# Patient Record
Sex: Female | Born: 1945 | Race: Black or African American | Hispanic: No | Marital: Single | Smoking: Former smoker
Health system: Southern US, Community
[De-identification: ages and names within clinical notes are randomized; demographics above are authoritative.]

## PROBLEM LIST (undated history)

## (undated) DIAGNOSIS — I10 Essential (primary) hypertension: Secondary | ICD-10-CM

## (undated) DIAGNOSIS — I639 Cerebral infarction, unspecified: Secondary | ICD-10-CM

---

## 2017-07-04 ENCOUNTER — Emergency Department (HOSPITAL_COMMUNITY)
Admission: EM | Admit: 2017-07-04 | Discharge: 2017-07-05 | Disposition: A | Discharge status: Home / Self Care | Payer: Medicare HMO | Attending: Emergency Medicine | Admitting: Emergency Medicine

## 2017-07-04 ENCOUNTER — Encounter (HOSPITAL_COMMUNITY): Payer: Self-pay

## 2017-07-04 DIAGNOSIS — Y998 Other external cause status: Secondary | ICD-10-CM | POA: Diagnosis not present

## 2017-07-04 DIAGNOSIS — Y929 Unspecified place or not applicable: Secondary | ICD-10-CM | POA: Diagnosis not present

## 2017-07-04 DIAGNOSIS — W01198A Fall on same level from slipping, tripping and stumbling with subsequent striking against other object, initial encounter: Secondary | ICD-10-CM | POA: Diagnosis not present

## 2017-07-04 DIAGNOSIS — W19XXXA Unspecified fall, initial encounter: Secondary | ICD-10-CM

## 2017-07-04 DIAGNOSIS — Y9389 Activity, other specified: Secondary | ICD-10-CM | POA: Diagnosis not present

## 2017-07-04 DIAGNOSIS — S0990XA Unspecified injury of head, initial encounter: Secondary | ICD-10-CM

## 2017-07-04 DIAGNOSIS — I10 Essential (primary) hypertension: Secondary | ICD-10-CM | POA: Insufficient documentation

## 2017-07-04 DIAGNOSIS — Z8673 Personal history of transient ischemic attack (TIA), and cerebral infarction without residual deficits: Secondary | ICD-10-CM | POA: Diagnosis not present

## 2017-07-04 DIAGNOSIS — S42212A Unspecified displaced fracture of surgical neck of left humerus, initial encounter for closed fracture: Secondary | ICD-10-CM | POA: Diagnosis not present

## 2017-07-04 HISTORY — DX: Essential (primary) hypertension: I10

## 2017-07-04 HISTORY — DX: Cerebral infarction, unspecified: I63.9

## 2017-07-04 NOTE — ED Triage Notes (Addendum)
Pt arrived from home via [facility] fell this morning pain on left shoulder pain increasing. Pt states she did hit her head but is not taking blood thinner at this time.

## 2017-07-04 NOTE — ED Notes (Signed)
Bed: [id number] Expected date:  Expected time:  Means of arrival:  Comments: Fall, shoulder pain

## 2017-07-05 ENCOUNTER — Emergency Department (HOSPITAL_COMMUNITY): Payer: Medicare HMO

## 2017-07-05 MED ORDER — TRAMADOL HCL 50 MG PO TABS: 50.0000 mg | ORAL_TABLET | Freq: Four times a day (QID) | ORAL | 0 refills | Status: DC | PRN

## 2017-07-05 MED ORDER — TRAMADOL HCL 50 MG PO TABS
50.0000 mg | ORAL_TABLET | Freq: Once | ORAL | Status: AC
  Administered 2017-07-05: 50 mg via ORAL
  Filled 2017-07-05: qty 1

## 2017-07-05 NOTE — Discharge Instructions (Addendum)
Your x-ray does show fracture of the left shoulder.  Wear the shoulder immobilizer.  Ice the area.  Have given you short course of tramadol to help with the pain.  Will also take Tylenol or Motrin.  Limit mobility of the shoulder.  Make sure that you follow-up with the orthopedic doctor.  They will see you in the office on Friday or Saturday.  You need to call today to schedule an appointment.  Follow-up with primary care doctor as well.  Return the ED with any worsening symptoms.

## 2017-07-05 NOTE — ED Provider Notes (Signed)
[facility]-EMERGENCY DEPT Provider Note   CSN: [id number] Arrival date & time: 07/04/17  2312     History   Chief Complaint Chief Complaint  Patient presents with  . Fall    HPI Michelle Irwin is a 72 y.o. female.  HPI 72 year old African-American female past medical history significant for CVA and hypertension presents to the emergency department today for evaluation of fall with left shoulder pain.  The patient states that earlier this morning she was walking and when she walked to the doorway she is not sure if she tripped but she fell backwards striking the back of her head and landing on her left shoulder.  Patient denies any loss of consciousness.  Denies any lightheadedness or dizziness.  Patient reports hitting the back of her head but denies take any blood thinners.  Patient states that she was on the floor for several minutes approximately 10-15 minutes before her daughter came to help her.  Patient states that she was helped to her feet and has been ambulatory since the event.  She reports ongoing left shoulder pain that is worse with palpation and range of motion.  Patient denies any associated headaches, lightheadedness or dizziness.  Denies any vision changes.  Denies any associated paresthesias or weakness.  She states that she did take some Tylenol earlier for pain but did not help.  Patient denies any chest pain or shortness of breath with the episode.  Pt denies any fever, chill, ha, vision changes, lightheadedness, dizziness, congestion, neck pain, cp, sob, cough, abd pain, n/v/d, urinary symptoms, change in bowel habits, melena, hematochezia, lower extremity paresthesias.  Past Medical History:  Diagnosis Date  . CVA (cerebral vascular accident) ([facility])    2016  . Hypertension     There are no active problems to display for this patient.   History reviewed. No pertinent surgical history.   OB History   None      Home Medications    Prior  to Admission medications   Medication Sig Start Date End Date Taking? Authorizing Provider  traMADol (ULTRAM) 50 MG tablet Take 1 tablet (50 mg total) by mouth every 6 (six) hours as needed. 07/05/17   Rise MuLeaphart, Jessicia Napolitano T, PA-C    Family History History reviewed. No pertinent family history.  Social History Social History   Tobacco Use  . Smoking status: Never Smoker  . Smokeless tobacco: Never Used  Substance Use Topics  . Alcohol use: Never    Frequency: Never  . Drug use: Never     Allergies   Penicillins   Review of Systems Review of Systems  All other systems reviewed and are negative.    Physical Exam Updated Vital Signs BP 117/81 (BP Location: Right Arm)   Pulse (!) 57   Temp 97.8 F (36.6 C) (Oral)   Resp 18   Ht 5\' 8"  (1.727 m)   Wt 86.2 kg (190 lb)   SpO2 94%   BMI 28.89 kg/m   Physical Exam  Constitutional: She is oriented to person, place, and time. She appears well-developed and well-nourished.  Non-toxic appearance. No distress.  HENT:  Head: Normocephalic and atraumatic.  Nose: Nose normal.  Mouth/Throat: Oropharynx is clear and moist.  Bilateral hemotympanum.  No septal hematoma.  No skull depression.  No hematoma noted.  Eyes: Pupils are equal, round, and reactive to light. Conjunctivae and EOM are normal. Right eye exhibits no discharge. Left eye exhibits no discharge.  Neck: Normal range of motion. Neck  supple.  No c spine midline tenderness. No paraspinal tenderness. No deformities or step offs noted. Full ROM. Supple. No nuchal rigidity.    Cardiovascular: Normal rate, regular rhythm, normal heart sounds and intact distal pulses. Exam reveals no gallop and no friction rub.  No murmur heard. Pulmonary/Chest: Effort normal and breath sounds normal. No stridor. No respiratory distress. She has no wheezes. She has no rales. She exhibits no tenderness.  Abdominal: Soft. Bowel sounds are normal. There is no tenderness. There is no rigidity, no  rebound, no guarding, no CVA tenderness, no tenderness at McBurney's point and negative Murphy's sign.  No ecchymosis noted.  Musculoskeletal:       Left shoulder: She exhibits decreased range of motion, tenderness, bony tenderness, swelling, pain and spasm. She exhibits no effusion, no crepitus, no deformity, no laceration, normal pulse and normal strength.  Axillary nerve intact.  Grip strength equal bilaterally.  Brisk cap refill.  Radial pulses 2+ bilaterally.  Sensation intact in all dermatomes.  No obvious deformity noted.  Skin compartments are soft.  No midline T spine or L spine tenderness. No deformities or step offs noted. Full ROM. Pelvis is stable.  Lymphadenopathy:    She has no cervical adenopathy.  Neurological: She is alert and oriented to person, place, and time.  The patient is alert, attentive, and oriented x 3. Speech is clear. Cranial nerve II-VII grossly intact. Negative pronator drift. Sensation intact. Strength 5/5 in all extremities. Reflexes 2+ and symmetric at biceps, triceps, knees, and ankles. Rapid alternating movement and fine finger movements intact.  Romberg and gait deferred due to patient having left shoulder pain.   Skin: Skin is warm and dry. Capillary refill takes less than 2 seconds.  Psychiatric: Her behavior is normal. Judgment and thought content normal.  Nursing note and vitals reviewed.    ED Treatments / Results  Labs (all labs ordered are listed, but only abnormal results are displayed) Labs Reviewed - No data to display  EKG None  Radiology Dg Elbow Complete Left  Result Date: 07/05/2017 CLINICAL DATA:  Left elbow pain after fall today. EXAM: LEFT ELBOW - COMPLETE 3+ VIEW COMPARISON:  None. FINDINGS: Five views of the left elbow are provided. Minimal degenerative spurring off the coronoid. There is no evidence of fracture, dislocation, or joint effusion. There is no evidence of arthropathy or other focal bone abnormality. Soft tissues are  unremarkable. IMPRESSION: Negative for fracture, joint effusion or joint dislocation. Electronically Signed   By: Tollie Eth M.D.   On: 07/05/2017 01:30   Ct Head Wo Contrast  Result Date: 07/05/2017 CLINICAL DATA:  Patient fell at home hitting the occiput. EXAM: CT HEAD WITHOUT CONTRAST CT CERVICAL SPINE WITHOUT CONTRAST TECHNIQUE: Multidetector CT imaging of the head and cervical spine was performed following the standard protocol without intravenous contrast. Multiplanar CT image reconstructions of the cervical spine were also generated. COMPARISON:  None. FINDINGS: CT HEAD FINDINGS Brain: Mild-to-moderate small vessel ischemic disease of periventricular white matter, in particular the left corona radiata. No hydrocephalus, midline shift or edema. Right basal ganglial calcifications are noted. No intra-axial mass nor extra-axial fluid collections. No large vascular territory infarct. Vascular: No hyperdense vessel or unexpected calcification. Skull: No acute skull fracture or suspicious osseous lesions. No significant scalp soft tissue swelling. Sinuses/Orbits: Mild ethmoid, sphenoid and right maxillary sinus mucosal thickening. Trace fluid level in the left maxillary sinus. The frontal sinus is unremarkable. The orbits are symmetric in appearance and intact. Intact globes. No retrobulbar  abnormality. Other: None. CT CERVICAL SPINE FINDINGS Alignment: Normal. Skull base and vertebrae: Intact craniocervical relationship. Osteoarthritis of the atlantodental interval. No acute cervical spine fracture. Intact skull base. Soft tissues and spinal canal: No prevertebral fluid or swelling. No visible canal hematoma. Disc levels: Mild disc space narrowing C4 through T2. Uncinate spurring secondary to uncovertebral joint osteoarthritis at C2-3, on the right at C5-6 and bilaterally at C6-7. Associated multilevel degenerative facet arthropathy. No significant central canal stenosis. C4-5 and C5-6 left-sided foraminal  stenosis from facet arthropathy and on the right from C5 through T1. Upper chest: Centrilobular emphysema in the lung apices with small right effusion. Atherosclerotic calcifications of the included aortic arch and great vessels. Other: Mild extracranial carotid arteriosclerosis bilaterally. IMPRESSION: 1. No acute intracranial abnormality. Chronic appearing small vessel ischemic disease periventricular white matter. 2. Mild paranasal sinus mucosal thickening. 3. Cervical spondylosis without acute cervical spine fracture. Electronically Signed   By: Tollie Eth M.D.   On: 07/05/2017 01:48   Ct Cervical Spine Wo Contrast  Result Date: 07/05/2017 CLINICAL DATA:  Patient fell at home hitting the occiput. EXAM: CT HEAD WITHOUT CONTRAST CT CERVICAL SPINE WITHOUT CONTRAST TECHNIQUE: Multidetector CT imaging of the head and cervical spine was performed following the standard protocol without intravenous contrast. Multiplanar CT image reconstructions of the cervical spine were also generated. COMPARISON:  None. FINDINGS: CT HEAD FINDINGS Brain: Mild-to-moderate small vessel ischemic disease of periventricular white matter, in particular the left corona radiata. No hydrocephalus, midline shift or edema. Right basal ganglial calcifications are noted. No intra-axial mass nor extra-axial fluid collections. No large vascular territory infarct. Vascular: No hyperdense vessel or unexpected calcification. Skull: No acute skull fracture or suspicious osseous lesions. No significant scalp soft tissue swelling. Sinuses/Orbits: Mild ethmoid, sphenoid and right maxillary sinus mucosal thickening. Trace fluid level in the left maxillary sinus. The frontal sinus is unremarkable. The orbits are symmetric in appearance and intact. Intact globes. No retrobulbar abnormality. Other: None. CT CERVICAL SPINE FINDINGS Alignment: Normal. Skull base and vertebrae: Intact craniocervical relationship. Osteoarthritis of the atlantodental interval.  No acute cervical spine fracture. Intact skull base. Soft tissues and spinal canal: No prevertebral fluid or swelling. No visible canal hematoma. Disc levels: Mild disc space narrowing C4 through T2. Uncinate spurring secondary to uncovertebral joint osteoarthritis at C2-3, on the right at C5-6 and bilaterally at C6-7. Associated multilevel degenerative facet arthropathy. No significant central canal stenosis. C4-5 and C5-6 left-sided foraminal stenosis from facet arthropathy and on the right from C5 through T1. Upper chest: Centrilobular emphysema in the lung apices with small right effusion. Atherosclerotic calcifications of the included aortic arch and great vessels. Other: Mild extracranial carotid arteriosclerosis bilaterally. IMPRESSION: 1. No acute intracranial abnormality. Chronic appearing small vessel ischemic disease periventricular white matter. 2. Mild paranasal sinus mucosal thickening. 3. Cervical spondylosis without acute cervical spine fracture. Electronically Signed   By: Tollie Eth M.D.   On: 07/05/2017 01:48   Dg Shoulder Left  Result Date: 07/05/2017 CLINICAL DATA:  Patient fell backward this afternoon. Left shoulder pain. EXAM: LEFT SHOULDER - 2+ VIEW COMPARISON:  None. FINDINGS: AP and scapular Y-views of the left shoulder demonstrate an acute surgical neck and proximal diaphyseal fracture dorsally angulated by approximately 32 degrees. One-quarter shaft width anterior displacement of the distal fracture fragment is seen. Intact humeral head and AC joint. Intact glenohumeral joint. The adjacent ribs and lung are nonacute. Scapula appears intact. IMPRESSION: Acute dorsally angulated surgical neck and proximal diaphyseal fracture of the humerus.  Electronically Signed   By: Tollie Eth M.D.   On: 07/05/2017 01:23   Dg Humerus Left  Result Date: 07/05/2017 CLINICAL DATA:  Patient fell this afternoon and presents with left shoulder pain. EXAM: LEFT HUMERUS - 2+ VIEW COMPARISON:  None.  FINDINGS: An acute, closed, dorsally angulated fracture of the surgical neck and proximal diaphysis of the left humerus is noted with slight comminution. Slight anterior displacement by 1/4 shaft width is noted. Intact AC and glenohumeral joints with osteoarthritic joint space narrowing. The adjacent ribs and lung are nonacute. IMPRESSION: Acute, dorsally angulated, anteriorly displaced closed fracture of the surgical neck and proximal diaphysis of the left humerus with slight comminution. Electronically Signed   By: Tollie Eth M.D.   On: 07/05/2017 01:26    Procedures Procedures (including critical care time)  Medications Ordered in ED Medications  traMADol (ULTRAM) tablet 50 mg (50 mg Oral Given 07/05/17 0135)     Initial Impression / Assessment and Plan / ED Course  I have reviewed the triage vital signs and the nursing notes.  Pertinent labs & imaging results that were available during my care of the patient were reviewed by me and considered in my medical decision making (see chart for details).     She presents to the ED for evaluation of left shoulder pain after mechanical fall earlier today.  Patient also reports hitting the back of her head but denies being any blood thinners.  Denies any LOC.  Patient has been ambulatory since the event.  She is neurovascular intact in all extremities.  No signs of intracranial, intrathoracic or intra-abdominal trauma.  CT scan of head and cervical spine revealed no acute abnormalities.  She does have a displaced and comminuted left surgical neck fracture of the humerus.  There is some angulation noted.  The skin compartments are soft and she is neurovascularly intact.  Limited range of motion secondary to pain.  I did speak with Dr. Ave Filter with orthopedic surgery.  He recommends shoulder immobilizer and will follow up in the clinic in 2 days.  Patient given short course of pain medication.  Encouraged rice therapy at home.  Patient is able to ambulate  with normal gait in the ED.  Pain is been managed in the ED.  Pt is hemodynamically stable, in NAD, & able to ambulate in the ED. Evaluation does not show pathology that would require ongoing emergent intervention or inpatient treatment. I explained the diagnosis to the patient. Pain has been managed & has no complaints prior to dc. Pt is comfortable with above plan and is stable for discharge at this time. All questions were answered prior to disposition. Strict return precautions for f/u to the ED were discussed. Encouraged follow up with PCP.  Pt seen and eval by my attending who is agreeable with the above plan.   Final Clinical Impressions(s) / ED Diagnoses   Final diagnoses:  Fall, initial encounter  Injury of head, initial encounter  Closed displaced fracture of surgical neck of left humerus, unspecified fracture morphology, initial encounter    ED Discharge Orders        Ordered    traMADol (ULTRAM) 50 MG tablet  Every 6 hours PRN     07/05/17 0413       Rise Mu, PA-C 07/05/17 1610    Shaune Pollack, MD 07/05/17 1408    Shaune Pollack, MD 07/05/17 1409

## 2018-12-12 DIAGNOSIS — Z008 Encounter for other general examination: Secondary | ICD-10-CM | POA: Diagnosis not present

## 2018-12-12 DIAGNOSIS — E669 Obesity, unspecified: Secondary | ICD-10-CM | POA: Diagnosis not present

## 2018-12-12 DIAGNOSIS — Z79899 Other long term (current) drug therapy: Secondary | ICD-10-CM | POA: Diagnosis not present

## 2018-12-12 DIAGNOSIS — I69352 Hemiplegia and hemiparesis following cerebral infarction affecting left dominant side: Secondary | ICD-10-CM | POA: Diagnosis not present

## 2018-12-12 DIAGNOSIS — M1990 Unspecified osteoarthritis, unspecified site: Secondary | ICD-10-CM | POA: Diagnosis not present

## 2018-12-12 DIAGNOSIS — I10 Essential (primary) hypertension: Secondary | ICD-10-CM | POA: Diagnosis not present

## 2018-12-12 DIAGNOSIS — Z1159 Encounter for screening for other viral diseases: Secondary | ICD-10-CM | POA: Diagnosis not present

## 2018-12-12 DIAGNOSIS — Z6833 Body mass index (BMI) 33.0-33.9, adult: Secondary | ICD-10-CM | POA: Diagnosis not present

## 2018-12-12 DIAGNOSIS — Z0000 Encounter for general adult medical examination without abnormal findings: Secondary | ICD-10-CM | POA: Diagnosis not present

## 2018-12-30 DIAGNOSIS — E785 Hyperlipidemia, unspecified: Secondary | ICD-10-CM | POA: Diagnosis not present

## 2018-12-30 DIAGNOSIS — Z6833 Body mass index (BMI) 33.0-33.9, adult: Secondary | ICD-10-CM | POA: Diagnosis not present

## 2018-12-30 DIAGNOSIS — I10 Essential (primary) hypertension: Secondary | ICD-10-CM | POA: Diagnosis not present

## 2018-12-30 DIAGNOSIS — I739 Peripheral vascular disease, unspecified: Secondary | ICD-10-CM | POA: Diagnosis not present

## 2018-12-30 DIAGNOSIS — Z23 Encounter for immunization: Secondary | ICD-10-CM | POA: Diagnosis not present

## 2018-12-30 DIAGNOSIS — Z1211 Encounter for screening for malignant neoplasm of colon: Secondary | ICD-10-CM | POA: Diagnosis not present

## 2018-12-30 DIAGNOSIS — E669 Obesity, unspecified: Secondary | ICD-10-CM | POA: Diagnosis not present

## 2018-12-30 DIAGNOSIS — Z008 Encounter for other general examination: Secondary | ICD-10-CM | POA: Diagnosis not present

## 2018-12-30 DIAGNOSIS — R7303 Prediabetes: Secondary | ICD-10-CM | POA: Diagnosis not present

## 2019-01-03 DIAGNOSIS — Z1211 Encounter for screening for malignant neoplasm of colon: Secondary | ICD-10-CM | POA: Diagnosis not present

## 2019-01-28 ENCOUNTER — Other Ambulatory Visit: Payer: Self-pay

## 2019-01-28 ENCOUNTER — Ambulatory Visit: Payer: Medicare HMO | Admitting: Podiatry

## 2019-01-28 DIAGNOSIS — B351 Tinea unguium: Secondary | ICD-10-CM | POA: Diagnosis not present

## 2019-01-28 DIAGNOSIS — B078 Other viral warts: Secondary | ICD-10-CM | POA: Diagnosis not present

## 2019-01-28 DIAGNOSIS — L989 Disorder of the skin and subcutaneous tissue, unspecified: Secondary | ICD-10-CM

## 2019-01-28 DIAGNOSIS — B079 Viral wart, unspecified: Secondary | ICD-10-CM

## 2019-01-28 DIAGNOSIS — M79675 Pain in left toe(s): Secondary | ICD-10-CM | POA: Diagnosis not present

## 2019-01-28 DIAGNOSIS — M79674 Pain in right toe(s): Secondary | ICD-10-CM

## 2019-01-28 NOTE — Progress Notes (Signed)
Subjective:   Patient ID: Michelle Irwin, female   DOB: 73 y.o.   MRN: [id number]   HPI 73 year old female presents the office today for multiple concerns. Concerns she has fungus on both of her big toenails.  The left side started at 73 years old and the right side started 73 years old.  She states that all of her nails also getting thick and discolored and cause some irritation inside shoes and she can no longer trim the nails herself.  She also states that she had a wart on the top of her right second toe for the last 3 years.  She has not changed in size but does rub the top of her shoes at times.  She has no other concerns today.   Review of Systems  All other systems reviewed and are negative.  Past Medical History:  Diagnosis Date  . CVA (cerebral vascular accident) ([facility])    2016  . Hypertension     No past surgical history on file.   Current Outpatient Medications:  .  amLODipine (NORVASC) 5 MG tablet, , Disp: , Rfl:  .  atorvastatin (LIPITOR) 10 MG tablet, , Disp: , Rfl:  .  traMADol (ULTRAM) 50 MG tablet, Take 1 tablet (50 mg total) by mouth every 6 (six) hours as needed., Disp: 8 tablet, Rfl: 0  Allergies  Allergen Reactions  . Penicillins          Objective:  Physical Exam  General: AAO x3, NAD  Dermatological: Toenails are hypertrophic, dystrophic with yellow-brown discoloration with hallux toenails but worse.  Mild incurvation of the hallux toenails.  No edema, erythema or signs of infections.  Subjective is irritation of the nails 1-5 bilaterally.  On the dorsal aspect of right second toe at the DIPJ there is a soft tissue lesion.  It does appear to be a verruca.  Vascular: Dorsalis Pedis artery and Posterior Tibial artery pedal pulses are 2/4 bilateral with immedate capillary fill time. There is no pain with calf compression, swelling, warmth, erythema.   Neruologic: Grossly intact via light touch bilateral.Protective threshold with Semmes Wienstein monofilament  intact to all pedal sites bilateral.   Musculoskeletal: Nails are hypertrophic, dystrophic, brittle, discolored, elongated 10. No surrounding redness or drainage. Tenderness nails 1-5 bilaterally. No open lesions or pre-ulcerative lesions are identified today.  Muscular strength 5/5 in all groups tested bilateral.  Gait: Unassisted, Nonantalgic.      Assessment:   73 year old female with right second toe skin lesion, likely verruca; symptomatic onychomycosis    Plan:  -Treatment options discussed including all alternatives, risks, and complications -Etiology of symptoms were discussed -Debrided the nails x10 without any complications or bleeding.  Discussed treatment options for nail fungus that she wants to consider treatment.  Would like to start with topical.  Prescribe compound cream today to [facility] inserts from use and side effects. -Lesion was lightly debrided of hyperkeratotic tissue in the dorsal right second toe.  There is no alcohol.  Cantharone was applied following occlusive bandage.  Post procedure instructions discussed.  Monitor for any signs or symptoms of infection.  SPECT possible surgical excision if no improvement.  RTC 3 weeks or sooner if needed  Trula Slade DPM

## 2019-01-28 NOTE — Patient Instructions (Signed)
Take dressing off in 8 hours and wash the foot with soap and water. If it is hurting or becomes uncomfortable before the 8 hours, go ahead and remove the bandage and wash the area.  If it blisters, apply antibiotic ointment and a band-aid.  Monitor for any signs/symptoms of infection. Call the office immediately if any occur or go directly to the emergency room. Call with any questions/concerns.   

## 2019-02-25 ENCOUNTER — Other Ambulatory Visit: Payer: Self-pay | Admitting: Family Medicine

## 2019-02-25 DIAGNOSIS — Z780 Asymptomatic menopausal state: Secondary | ICD-10-CM

## 2019-05-01 ENCOUNTER — Ambulatory Visit: Payer: Medicare HMO | Admitting: Podiatry

## 2019-05-08 ENCOUNTER — Other Ambulatory Visit: Payer: Self-pay

## 2019-05-08 ENCOUNTER — Ambulatory Visit: Payer: Medicare HMO | Admitting: Podiatry

## 2019-05-08 DIAGNOSIS — M79674 Pain in right toe(s): Secondary | ICD-10-CM

## 2019-05-08 DIAGNOSIS — B351 Tinea unguium: Secondary | ICD-10-CM

## 2019-05-08 DIAGNOSIS — M79675 Pain in left toe(s): Secondary | ICD-10-CM

## 2019-05-08 DIAGNOSIS — L989 Disorder of the skin and subcutaneous tissue, unspecified: Secondary | ICD-10-CM | POA: Diagnosis not present

## 2019-05-08 DIAGNOSIS — L84 Corns and callosities: Secondary | ICD-10-CM

## 2019-05-08 MED ORDER — CICLOPIROX 8 % EX SOLN: Freq: Every day | CUTANEOUS | 2 refills | Status: AC

## 2019-05-08 NOTE — Patient Instructions (Addendum)
Take dressing off in 8 hours and wash the foot with soap and water. If it is hurting or becomes uncomfortable before the 8 hours, go ahead and remove the bandage and wash the area.  If it blisters, apply antibiotic ointment and a band-aid.  Monitor for any signs/symptoms of infection. Call the office immediately if any occur or go directly to the emergency room. Call with any questions/concerns.  Ciclopirox nail solution What is this medicine? CICLOPIROX (sye kloe PEER ox) NAIL SOLUTION is an antifungal medicine. It used to treat fungal infections of the nails. This medicine may be used for other purposes; ask your health care provider or pharmacist if you have questions. COMMON BRAND NAME(S): [id number], Penlac What should I tell my health care provider before I take this medicine? They need to know if you have any of these conditions:  diabetes mellitus  history of seizures  HIV infection  immune system problems or organ transplant  large areas of burned or damaged skin  peripheral vascular disease or poor circulation  taking corticosteroid medication (including steroid inhalers, cream, or lotion)  an unusual or allergic reaction to ciclopirox, isopropyl alcohol, other medicines, foods, dyes, or preservatives  pregnant or trying to get pregnant  breast-feeding How should I use this medicine? This medicine is for external use only. Follow the directions that come with this medicine exactly. Wash and dry your hands before use. Avoid contact with the eyes, mouth or nose. If you do get this medicine in your eyes, rinse out with plenty of cool tap water. Contact your doctor or health care professional if eye irritation occurs. Use at regular intervals. Do not use your medicine more often than directed. Finish the full course prescribed by your doctor or health care professional even if you think you are better. Do not stop using except on your doctor's advice. Talk to your pediatrician  regarding the use of this medicine in children. While this medicine may be prescribed for children as young as 12 years for selected conditions, precautions do apply. Overdosage: If you think you have taken too much of this medicine contact a poison control center or emergency room at once. NOTE: This medicine is only for you. Do not share this medicine with others. What if I miss a dose? If you miss a dose, use it as soon as you can. If it is almost time for your next dose, use only that dose. Do not use double or extra doses. What may interact with this medicine? Interactions are not expected. Do not use any other skin products without telling your doctor or health care professional. This list may not describe all possible interactions. Give your health care provider a list of all the medicines, herbs, non-prescription drugs, or dietary supplements you use. Also tell them if you smoke, drink alcohol, or use illegal drugs. Some items may interact with your medicine. What should I watch for while using this medicine? Tell your doctor or health care professional if your symptoms get worse. Four to six months of treatment may be needed for the nail(s) to improve. Some people may not achieve a complete cure or clearing of the nails by this time. Tell your doctor or health care professional if you develop sores or blisters that do not heal properly. If your nail infection returns after stopping using this product, contact your doctor or health care professional. What side effects may I notice from receiving this medicine? Side effects that you should report to your   doctor or health care professional as soon as possible:  allergic reactions like skin rash, itching or hives, swelling of the face, lips, or tongue  severe irritation, redness, burning, blistering, peeling, swelling, oozing Side effects that usually do not require medical attention (report to your doctor or health care professional if they  continue or are bothersome):  mild reddening of the skin  nail discoloration  temporary burning or mild stinging at the site of application This list may not describe all possible side effects. Call your doctor for medical advice about side effects. You may report side effects to FDA at 1-800-FDA-1088. Where should I keep my medicine? Keep out of the reach of children. Store at room temperature between 15 and 30 degrees C (59 and 86 degrees F). Do not freeze. Protect from light by storing the bottle in the carton after every use. This medicine is flammable. Keep away from heat and flame. Throw away any unused medicine after the expiration date. NOTE: This sheet is a summary. It may not cover all possible information. If you have questions about this medicine, talk to your doctor, pharmacist, or health care provider.  2020 Elsevier/Gold Standard (2007-06-17 16:49:20)   

## 2019-05-13 NOTE — Progress Notes (Signed)
Subjective: 74 year old female presents the office today for follow evaluation of nail fungus and for thick, elongated toenails that she cannot trim her self. She is not able to get the compound cream due to cost. She also has a skin lesion on the right second toe which we applied Cantharone last appointment and she had no side effects this and she states is doing much better. She has no other concerns today. No recent injury or trauma since I last saw her. Denies any systemic complaints such as fevers, chills, nausea, vomiting. No acute changes since last appointment, and no other complaints at this time.   Objective: AAO x3, NAD DP/PT pulses palpable bilaterally, CRT less than 3 seconds Nails appear to be hypertrophic, dystrophic, discolored with yellow-brown discoloration. Nails do cause irritation inside shoes. Minimal hyperkeratotic tissue on the right hallux. No other open lesions or preulcerative lesions.  On the dorsal right second DIPJ there is soft tissue lesion. There appears to be more of a mucoid cyst but there is no significant fluid to the area. No pain with calf compression, swelling, warmth, erythema  Assessment: Soft tissue lesion right second toe, symptomatic onychomycosis, hyperkeratotic lesion  Plan: -All treatment options discussed with the patient including all alternatives, risks, complications.  -Nails debrided x2 without any complications or bleeding. Prescribed Penlac and directed on use -Debrided skin lesion right second toe there is mild hyperkeratotic tissue around the area. This is complete without any bleeding. The area skin with alcohol and Cantharone was applied followed by an occlusive bandage. Post procedure instructions discussed. We discussed surgical excision or draining but she was to hold off on that. -Hyperkeratotic lesion debrided x1 without complications or bleeding. -Patient encouraged to call the office with any questions, concerns, change in symptoms.    Return in about 6 weeks (around 06/19/2019).  Vivi Barrack DPM

## 2019-05-21 ENCOUNTER — Other Ambulatory Visit: Payer: Self-pay

## 2019-05-21 ENCOUNTER — Ambulatory Visit
Admission: RE | Admit: 2019-05-21 | Discharge: 2019-05-21 | Disposition: A | Discharge status: Home / Self Care | Payer: Medicare HMO | Source: Ambulatory Visit | Attending: Family | Admitting: Family

## 2019-05-21 DIAGNOSIS — Z780 Asymptomatic menopausal state: Secondary | ICD-10-CM

## 2019-06-02 ENCOUNTER — Ambulatory Visit
Admission: RE | Admit: 2019-06-02 | Discharge: 2019-06-02 | Disposition: A | Discharge status: Home / Self Care | Payer: Medicare HMO | Source: Ambulatory Visit | Attending: Family Medicine | Admitting: Family Medicine

## 2019-06-02 ENCOUNTER — Other Ambulatory Visit: Payer: Self-pay | Admitting: Family Medicine

## 2019-06-02 DIAGNOSIS — F17210 Nicotine dependence, cigarettes, uncomplicated: Secondary | ICD-10-CM

## 2019-06-02 DIAGNOSIS — M5489 Other dorsalgia: Secondary | ICD-10-CM

## 2019-06-19 ENCOUNTER — Ambulatory Visit: Payer: Medicare HMO | Admitting: Podiatry

## 2019-06-19 ENCOUNTER — Encounter: Payer: Self-pay | Admitting: Podiatry

## 2019-06-19 ENCOUNTER — Other Ambulatory Visit: Payer: Self-pay

## 2019-06-19 VITALS — Temp 98.0°F

## 2019-06-19 DIAGNOSIS — B351 Tinea unguium: Secondary | ICD-10-CM

## 2019-06-19 DIAGNOSIS — M79674 Pain in right toe(s): Secondary | ICD-10-CM

## 2019-06-19 DIAGNOSIS — M79675 Pain in left toe(s): Secondary | ICD-10-CM

## 2019-06-23 NOTE — Progress Notes (Signed)
Subjective: 74 year old female presents the office today for follow evaluation of nail fungus and for thick, elongated toenails that she cannot trim herself as well as for his skin lesion on the dorsal right second toe.  Overall the nail removed second toe is no longer causing any pain and she is very happy with the results. Denies any systemic complaints such as fevers, chills, nausea, vomiting. No acute changes since last appointment, and no other complaints at this time.   Objective: AAO x3, NAD DP/PT pulses palpable bilaterally, CRT less than 3 seconds Nails appear to be hypertrophic, dystrophic, discolored with yellow-brown discoloration x10. Nails do cause discomfort inside shoes. Minimal hyperkeratotic tissue on the right hallux. No other open lesions or preulcerative lesions.  On the dorsal right second DIPJ there is soft tissue lesion which appears to be almost resolved.  There is no pain.  No edema, erythema or signs of infection.  No fluid is present. No pain with calf compression, swelling, warmth, erythema  Assessment: Soft tissue lesion right second toe, symptomatic onychomycosis, hyperkeratotic lesion  Plan: -All treatment options discussed with the patient including all alternatives, risks, complications.  -Nails debrided x10 without any complications or bleeding.  Continue Penlac -Right second toe is doing well.  Hold off on further treatment today. -Hyperkeratotic lesion debrided x1 without complications or bleeding. -Patient encouraged to call the office with any questions, concerns, change in symptoms.   Return for nail trim/fungus in 2-3 months .  Vivi Barrack DPM

## 2019-09-22 ENCOUNTER — Ambulatory Visit: Payer: Medicare HMO | Admitting: Podiatry

## 2019-10-08 ENCOUNTER — Ambulatory Visit (INDEPENDENT_AMBULATORY_CARE_PROVIDER_SITE_OTHER): Payer: Self-pay | Admitting: Emergency Medicine

## 2019-10-08 ENCOUNTER — Other Ambulatory Visit: Payer: Self-pay

## 2019-10-08 ENCOUNTER — Encounter: Payer: Self-pay | Admitting: Emergency Medicine

## 2019-10-08 DIAGNOSIS — J449 Chronic obstructive pulmonary disease, unspecified: Secondary | ICD-10-CM

## 2019-10-08 MED ORDER — SPIRIVA RESPIMAT 2.5 MCG/ACT IN AERS: 2.0000 | INHALATION_SPRAY | Freq: Every day | RESPIRATORY_TRACT | 0 refills | Status: AC

## 2019-10-08 NOTE — Assessment & Plan Note (Signed)
Presumed COPD based on her clinical history, response to albuterol and tobacco history.  She needs full pulmonary function testing we will arrange for this.  The meantime I will do a trial of Spiriva Respimat to see if she gets any benefit.  We will review next time.   We will arrange for pulmonary function testing at your next office visit Please start Spiriva Respimat, 2 puffs once daily.  Take this every day as a maintenance medication Keep your albuterol available to use 2 puffs up to every 4 hours if you need it for shortness of breath, chest tightness, wheezing. Follow with Dr. Delton Coombes next available with full pulmonary function testing on the same day.

## 2019-10-08 NOTE — Patient Instructions (Signed)
We will arrange for pulmonary function testing at your next office visit Please start Spiriva Respimat, 2 puffs once daily.  Take this every day as a maintenance medication Keep your albuterol available to use 2 puffs up to every 4 hours if you need it for shortness of breath, chest tightness, wheezing. Follow with Dr. Delton Coombes next available with full pulmonary function testing on the same day.

## 2019-10-08 NOTE — Progress Notes (Signed)
Subjective:    Patient ID: Michelle Irwin, female    DOB: 03-Oct-1945, 74 y.o.   MRN: [id number]  HPI 74 year old former smoker (15 pk-yrs) with a history of hypertension, CVA.  She is referred today for suspected COPD and exertional dyspnea by Consuela Mimes, NP.   She has noticed exertional SOB over the last 3-4 months. She has found herself panting and SOB with inclines or stairs. No CP, cough. She does sometimes hear wheeze with heavier exertion. She is able to walk indefinitely on flat ground. She just got albuterol and has tried it - she has felt some benefit.   CXR done 06/03/19 reviewed by me, no overt infiltrates but some evidence for peribronchial inflammation.     Review of Systems As per HPI  Past Medical History:  Diagnosis Date  . CVA (cerebral vascular accident) (HCC)    2016  . Hypertension      History reviewed. No pertinent family history.   Social History   Socioeconomic History  . Marital status: Single    Spouse name: Not on file  . Number of children: Not on file  . Years of education: Not on file  . Highest education level: Not on file  Occupational History  . Not on file  Tobacco Use  . Smoking status: Former Smoker    Quit date: 2016    Years since quitting: 5.5  . Smokeless tobacco: Never Used  Substance and Sexual Activity  . Alcohol use: Never  . Drug use: Never  . Sexual activity: Not on file  Other Topics Concern  . Not on file  Social History Narrative  . Not on file   Social Determinants of Health   Financial Resource Strain:   . Difficulty of Paying Living Expenses:   Food Insecurity:   . Worried About Programme researcher, broadcasting/film/video in the Last Year:   . Barista in the Last Year:   Transportation Needs:   . Freight forwarder (Medical):   Marland Kitchen Lack of Transportation (Non-Medical):   Physical Activity:   . Days of Exercise per Week:   . Minutes of Exercise per Session:   Stress:   . Feeling of Stress :   Social Connections:   .  Frequency of Communication with Friends and Family:   . Frequency of Social Gatherings with Friends and Family:   . Attends Religious Services:   . Active Member of Clubs or Organizations:   . Attends Banker Meetings:   Marland Kitchen Marital Status:   Intimate Partner Violence:   . Fear of Current or Ex-Partner:   . Emotionally Abused:   Marland Kitchen Physically Abused:   . Sexually Abused:    From [place], has lived there and [place] Has worked in housekeeping, some cleaning fluid exposures.  No other exposures.   Allergies  Allergen Reactions  . Penicillins      Outpatient Medications Prior to Visit  Medication Sig Dispense Refill  . amLODipine (NORVASC) 5 MG tablet     . atorvastatin (LIPITOR) 10 MG tablet     . ciclopirox (PENLAC) 8 % solution Apply topically at bedtime. Apply over nail and surrounding skin. Apply daily over previous coat. After seven (7) days, may remove with alcohol and continue cycle. 6.6 mL 2  . NON FORMULARY [facility]  Antifungal (nail)-#1    . traMADol (ULTRAM) 50 MG tablet Take 1 tablet (50 mg total) by mouth every 6 (six) hours as needed. 8 tablet 0  No facility-administered medications prior to visit.        Objective:   Physical Exam  Vitals:   10/08/19 1428  BP: 132/86  Pulse: 70  Temp: 98.5 F (36.9 C)  TempSrc: Oral  SpO2: 97%  Weight: 228 lb 3.2 oz (103.5 kg)  Height: 5\' 8"  (1.727 m)   Gen: Pleasant, overwt woman, in no distress,  normal affect  ENT: No lesions,  mouth clear,  oropharynx clear, no postnasal drip  Neck: No JVD, no stridor  Lungs: No use of accessory muscles, no crackles or wheezing on normal respiration, no wheeze on forced expiration  Cardiovascular: RRR, heart sounds normal, no murmur or gallops, no peripheral edema  Musculoskeletal: No deformities, no cyanosis or clubbing  Neuro: alert, awake, non focal  Skin: Warm, no lesions or rash      Assessment & Plan:  COPD (chronic obstructive pulmonary  disease) (HCC) Presumed COPD based on her clinical history, response to albuterol and tobacco history.  She needs full pulmonary function testing we will arrange for this.  The meantime I will do a trial of Spiriva Respimat to see if she gets any benefit.  We will review next time.   We will arrange for pulmonary function testing at your next office visit Please start Spiriva Respimat, 2 puffs once daily.  Take this every day as a maintenance medication Keep your albuterol available to use 2 puffs up to every 4 hours if you need it for shortness of breath, chest tightness, wheezing. Follow with Dr. next available with full pulmonary function testing on the same day.  Delton Coombes, MD, PhD 10/08/2019, 2:49 PM [facility] [phone] or if no answer [phone]

## 2019-11-18 ENCOUNTER — Other Ambulatory Visit (HOSPITAL_COMMUNITY): Payer: Self-pay

## 2019-11-21 ENCOUNTER — Other Ambulatory Visit: Payer: Self-pay

## 2019-11-21 ENCOUNTER — Ambulatory Visit: Payer: Self-pay | Admitting: Emergency Medicine

## 2020-01-02 ENCOUNTER — Other Ambulatory Visit (HOSPITAL_COMMUNITY): Payer: Self-pay

## 2020-01-05 ENCOUNTER — Ambulatory Visit: Payer: Medicare (Managed Care) | Admitting: Emergency Medicine

## 2021-01-05 ENCOUNTER — Other Ambulatory Visit (HOSPITAL_BASED_OUTPATIENT_CLINIC_OR_DEPARTMENT_OTHER): Payer: Self-pay | Admitting: Registered Nurse

## 2021-01-05 DIAGNOSIS — M25512 Pain in left shoulder: Secondary | ICD-10-CM

## 2021-01-05 DIAGNOSIS — G8929 Other chronic pain: Secondary | ICD-10-CM

## 2021-01-07 ENCOUNTER — Other Ambulatory Visit: Payer: Self-pay

## 2021-01-07 ENCOUNTER — Ambulatory Visit (HOSPITAL_BASED_OUTPATIENT_CLINIC_OR_DEPARTMENT_OTHER)
Admission: RE | Admit: 2021-01-07 | Discharge: 2021-01-07 | Disposition: A | Discharge status: Home / Self Care | Payer: Medicare (Managed Care) | Source: Ambulatory Visit | Attending: Registered Nurse | Admitting: Registered Nurse

## 2021-01-07 DIAGNOSIS — M25512 Pain in left shoulder: Secondary | ICD-10-CM | POA: Insufficient documentation

## 2021-01-07 DIAGNOSIS — G8929 Other chronic pain: Secondary | ICD-10-CM

## 2022-02-10 DIAGNOSIS — Z6833 Body mass index (BMI) 33.0-33.9, adult: Secondary | ICD-10-CM | POA: Diagnosis not present

## 2022-02-10 DIAGNOSIS — E669 Obesity, unspecified: Secondary | ICD-10-CM | POA: Diagnosis not present

## 2022-02-10 DIAGNOSIS — Z23 Encounter for immunization: Secondary | ICD-10-CM | POA: Diagnosis not present

## 2022-02-10 DIAGNOSIS — I130 Hypertensive heart and chronic kidney disease with heart failure and stage 1 through stage 4 chronic kidney disease, or unspecified chronic kidney disease: Secondary | ICD-10-CM | POA: Diagnosis not present

## 2022-02-10 DIAGNOSIS — Z0000 Encounter for general adult medical examination without abnormal findings: Secondary | ICD-10-CM | POA: Diagnosis not present

## 2022-02-10 DIAGNOSIS — Z79899 Other long term (current) drug therapy: Secondary | ICD-10-CM | POA: Diagnosis not present

## 2022-02-10 DIAGNOSIS — E1169 Type 2 diabetes mellitus with other specified complication: Secondary | ICD-10-CM | POA: Diagnosis not present

## 2022-02-10 DIAGNOSIS — E785 Hyperlipidemia, unspecified: Secondary | ICD-10-CM | POA: Diagnosis not present

## 2022-02-17 DIAGNOSIS — Z79899 Other long term (current) drug therapy: Secondary | ICD-10-CM | POA: Diagnosis not present

## 2022-03-06 DIAGNOSIS — Z87891 Personal history of nicotine dependence: Secondary | ICD-10-CM | POA: Diagnosis not present

## 2022-03-06 DIAGNOSIS — I69354 Hemiplegia and hemiparesis following cerebral infarction affecting left non-dominant side: Secondary | ICD-10-CM | POA: Diagnosis not present

## 2022-03-06 DIAGNOSIS — I739 Peripheral vascular disease, unspecified: Secondary | ICD-10-CM | POA: Diagnosis not present

## 2022-03-06 DIAGNOSIS — E669 Obesity, unspecified: Secondary | ICD-10-CM | POA: Diagnosis not present

## 2022-03-06 DIAGNOSIS — R32 Unspecified urinary incontinence: Secondary | ICD-10-CM | POA: Diagnosis not present

## 2022-03-06 DIAGNOSIS — Z880 Allergy status to penicillin: Secondary | ICD-10-CM | POA: Diagnosis not present

## 2022-03-06 DIAGNOSIS — Z6834 Body mass index (BMI) 34.0-34.9, adult: Secondary | ICD-10-CM | POA: Diagnosis not present

## 2022-03-06 DIAGNOSIS — Z823 Family history of stroke: Secondary | ICD-10-CM | POA: Diagnosis not present

## 2022-03-06 DIAGNOSIS — I10 Essential (primary) hypertension: Secondary | ICD-10-CM | POA: Diagnosis not present

## 2022-03-06 DIAGNOSIS — J449 Chronic obstructive pulmonary disease, unspecified: Secondary | ICD-10-CM | POA: Diagnosis not present

## 2022-03-06 DIAGNOSIS — Z008 Encounter for other general examination: Secondary | ICD-10-CM | POA: Diagnosis not present

## 2022-03-06 DIAGNOSIS — G8929 Other chronic pain: Secondary | ICD-10-CM | POA: Diagnosis not present

## 2022-03-06 DIAGNOSIS — Z7951 Long term (current) use of inhaled steroids: Secondary | ICD-10-CM | POA: Diagnosis not present

## 2022-03-16 ENCOUNTER — Other Ambulatory Visit: Payer: Self-pay | Admitting: Registered Nurse

## 2022-03-16 DIAGNOSIS — R9409 Abnormal results of other function studies of central nervous system: Secondary | ICD-10-CM

## 2022-04-04 ENCOUNTER — Other Ambulatory Visit: Payer: Medicare (Managed Care)

## 2022-04-17 ENCOUNTER — Ambulatory Visit
Admission: RE | Admit: 2022-04-17 | Discharge: 2022-04-17 | Disposition: A | Discharge status: Home / Self Care | Payer: Medicare Other | Source: Ambulatory Visit | Attending: Registered Nurse | Admitting: Registered Nurse

## 2022-04-17 DIAGNOSIS — R9409 Abnormal results of other function studies of central nervous system: Secondary | ICD-10-CM

## 2022-12-21 DIAGNOSIS — J449 Chronic obstructive pulmonary disease, unspecified: Secondary | ICD-10-CM | POA: Diagnosis not present

## 2022-12-21 DIAGNOSIS — Z79899 Other long term (current) drug therapy: Secondary | ICD-10-CM | POA: Diagnosis not present

## 2022-12-21 DIAGNOSIS — Z0189 Encounter for other specified special examinations: Secondary | ICD-10-CM | POA: Diagnosis not present

## 2022-12-21 DIAGNOSIS — I70222 Atherosclerosis of native arteries of extremities with rest pain, left leg: Secondary | ICD-10-CM | POA: Diagnosis not present

## 2022-12-21 DIAGNOSIS — Z6832 Body mass index (BMI) 32.0-32.9, adult: Secondary | ICD-10-CM | POA: Diagnosis not present

## 2022-12-21 DIAGNOSIS — E669 Obesity, unspecified: Secondary | ICD-10-CM | POA: Diagnosis not present

## 2022-12-21 DIAGNOSIS — I10 Essential (primary) hypertension: Secondary | ICD-10-CM | POA: Diagnosis not present

## 2023-06-04 IMAGING — DX DG SHOULDER 2+V*L*
3 series · 3 of 3 positions shown · non-contrast
Comparison: Chest x-ray 06/02/2019.  Left shoulder 07/05/2017.

CLINICAL DATA: Chronic left shoulder pain. Prior history of
fracture. No known recent injury.

EXAM:
LEFT SHOULDER - 2+ VIEW

[shoulder grashey]
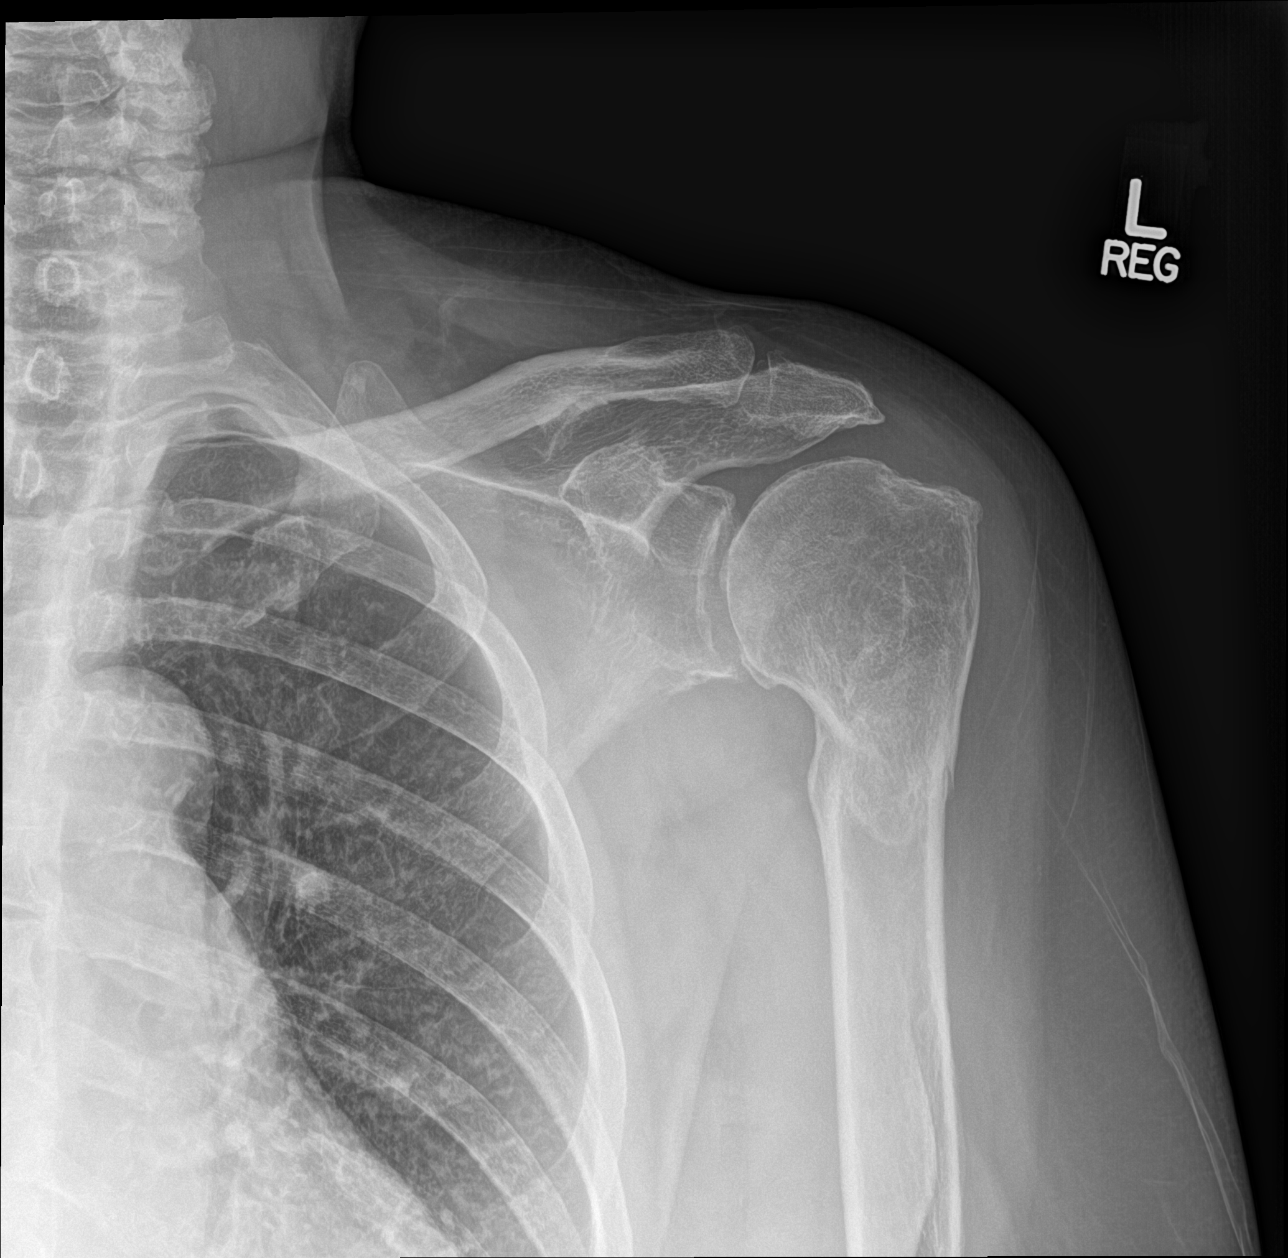

[shoulder y view]
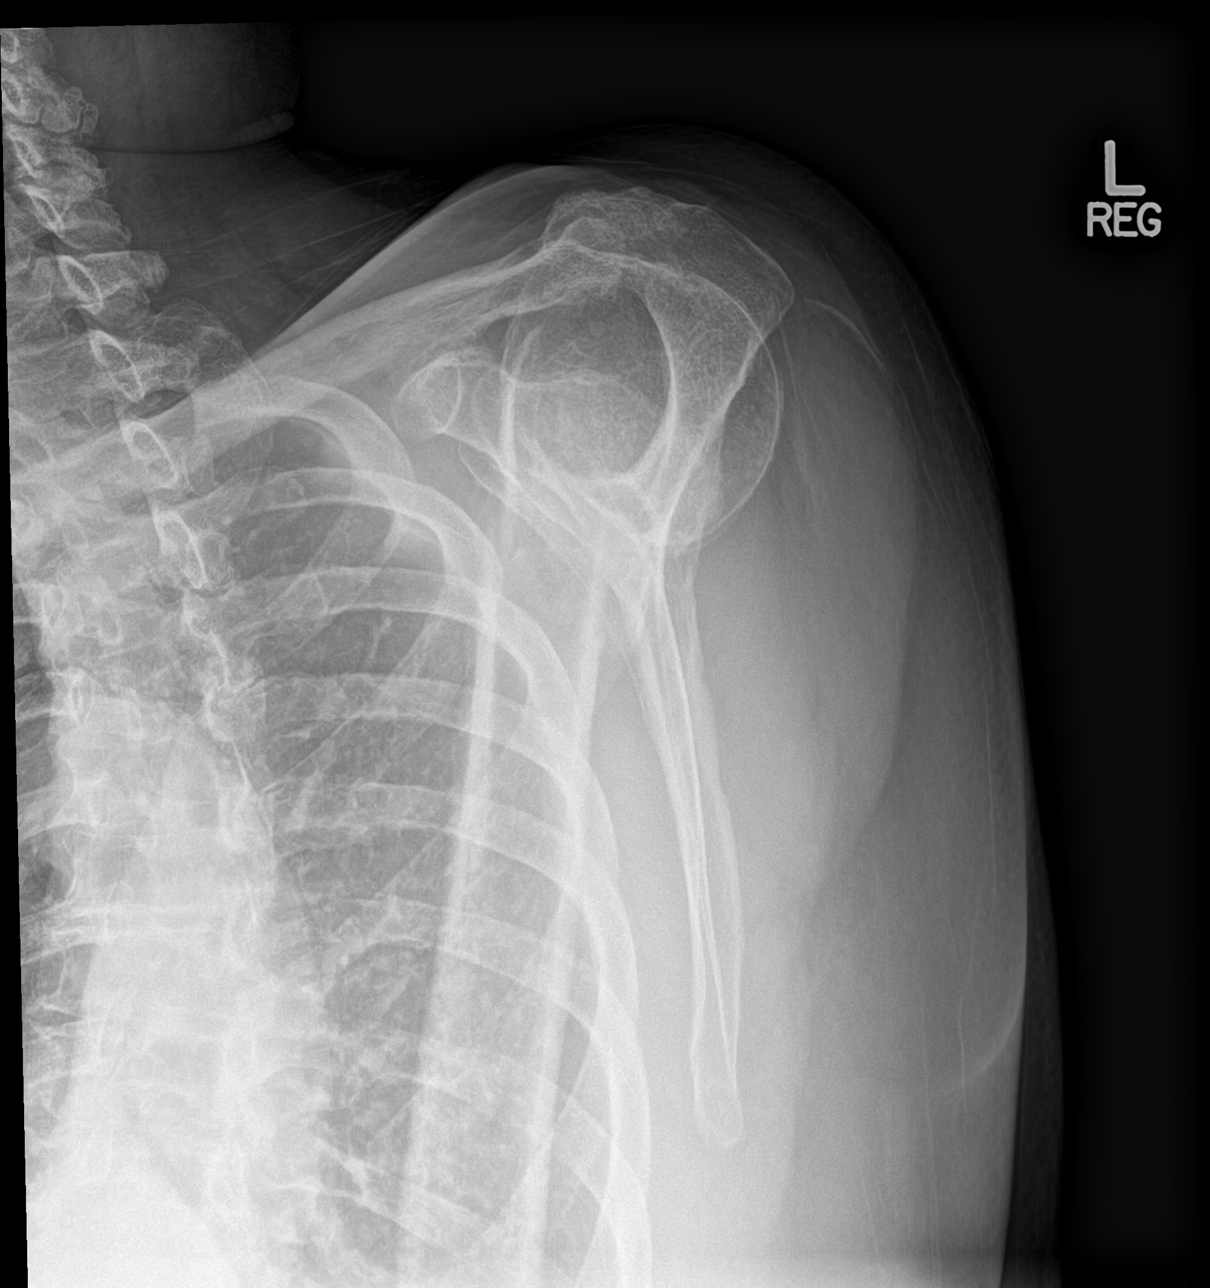

[shoulder axillary]
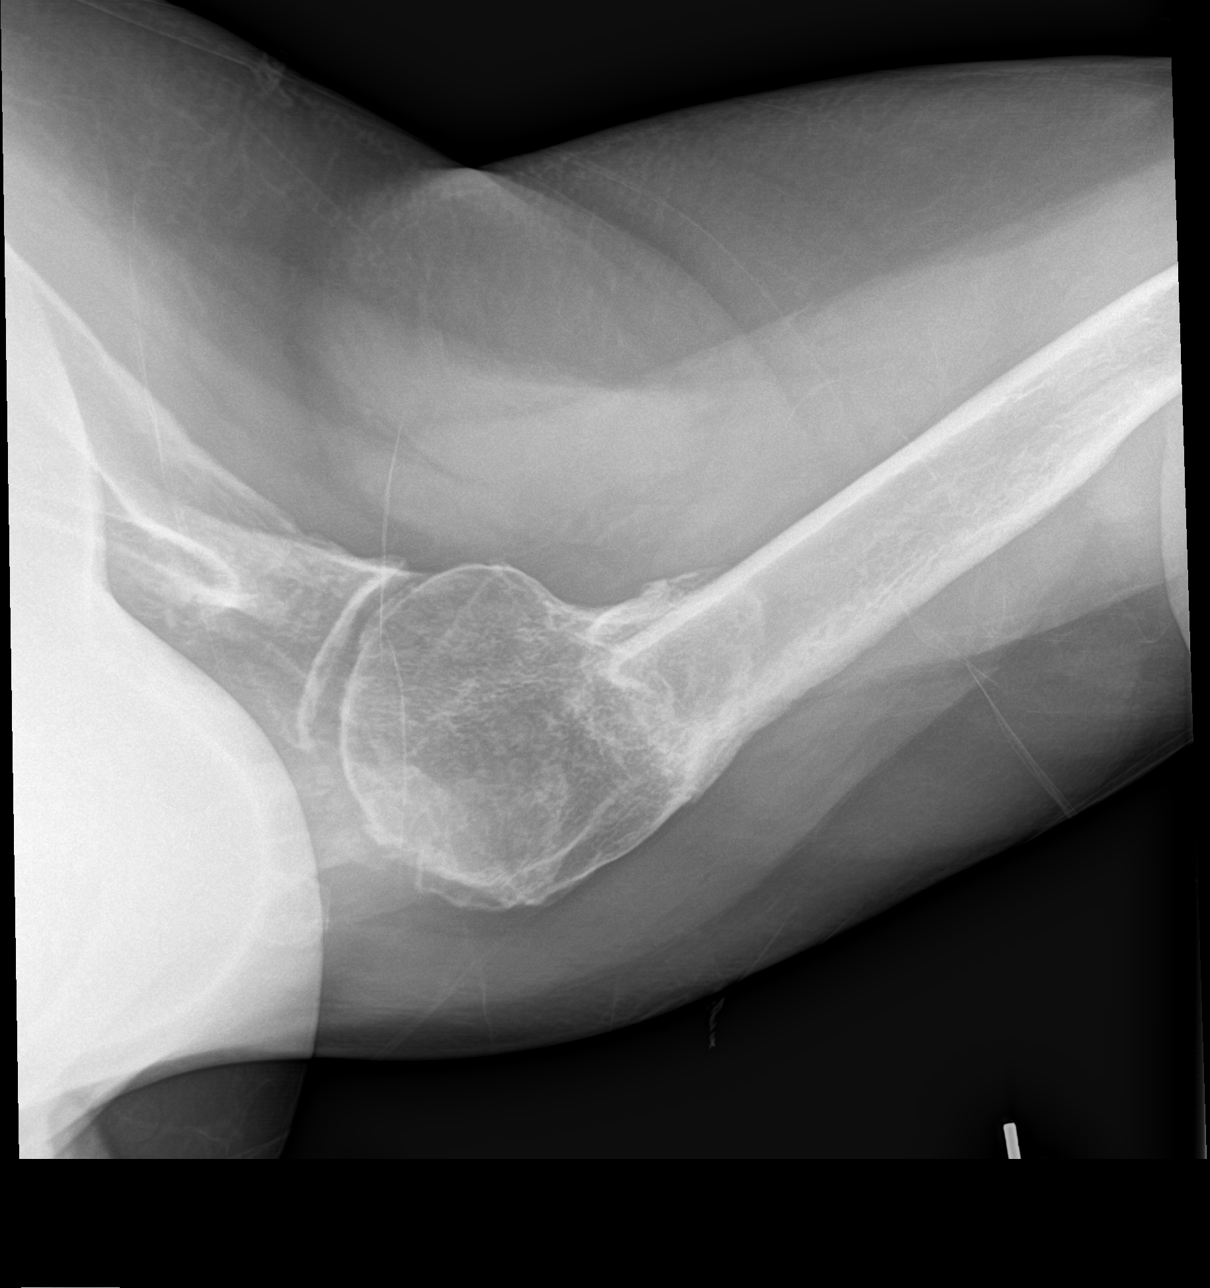

[3 of 3 positions shown; findings below may reference images not displayed]

FINDINGS: Acromioclavicular and glenohumeral degenerative change. Deformity of
the proximal left humerus noted consistent with prior fracture.
Corticated lucency noted along the lateral margin of the prior
fracture site most likely related to prior fracture. No acute bony
or joint abnormality identified. No evidence of acute fracture,
dislocation, or separation.
IMPRESSION: 1.  Acromioclavicular and glenohumeral degenerative change.

2. Deformity of the proximal left humerus consistent with prior
fracture. No acute abnormality identified.
